# Patient Record
Sex: Male | Born: 1935 | Race: White | Hispanic: No | Marital: Married | Smoking: Former smoker
Health system: Southern US, Community
[De-identification: ages and names within clinical notes are randomized; demographics above are authoritative.]

## PROBLEM LIST (undated history)

## (undated) ENCOUNTER — Emergency Department (HOSPITAL_COMMUNITY): Payer: Medicare Other

## (undated) DIAGNOSIS — I10 Essential (primary) hypertension: Secondary | ICD-10-CM

## (undated) DIAGNOSIS — E785 Hyperlipidemia, unspecified: Secondary | ICD-10-CM

## (undated) HISTORY — DX: Essential (primary) hypertension: I10

## (undated) HISTORY — DX: Hyperlipidemia, unspecified: E78.5

---

## 1999-05-12 ENCOUNTER — Encounter: Admission: RE | Admit: 1999-05-12 | Discharge: 1999-05-12 | Payer: Self-pay | Admitting: Internal Medicine

## 1999-05-12 ENCOUNTER — Encounter: Payer: Self-pay | Admitting: Internal Medicine

## 2010-05-26 ENCOUNTER — Emergency Department (HOSPITAL_COMMUNITY)
Admission: EM | Admit: 2010-05-26 | Discharge: 2010-05-26 | Payer: Self-pay | Source: Home / Self Care | Admitting: Emergency Medicine

## 2014-07-06 ENCOUNTER — Encounter: Payer: Self-pay | Admitting: Gastroenterology

## 2015-01-10 ENCOUNTER — Ambulatory Visit
Admission: RE | Admit: 2015-01-10 | Discharge: 2015-01-10 | Disposition: A | Discharge status: Home / Self Care | Payer: Medicare Other | Source: Ambulatory Visit | Attending: Internal Medicine | Admitting: Internal Medicine

## 2015-01-10 ENCOUNTER — Other Ambulatory Visit: Payer: Self-pay | Admitting: Internal Medicine

## 2015-01-10 DIAGNOSIS — M549 Dorsalgia, unspecified: Secondary | ICD-10-CM

## 2016-04-10 ENCOUNTER — Other Ambulatory Visit: Payer: Self-pay | Admitting: Internal Medicine

## 2016-04-10 ENCOUNTER — Ambulatory Visit
Admission: RE | Admit: 2016-04-10 | Discharge: 2016-04-10 | Disposition: A | Discharge status: Home / Self Care | Payer: Medicare Other | Source: Ambulatory Visit | Attending: Internal Medicine | Admitting: Internal Medicine

## 2016-04-10 DIAGNOSIS — M25552 Pain in left hip: Secondary | ICD-10-CM

## 2019-07-20 ENCOUNTER — Ambulatory Visit: Payer: Medicare Other | Attending: Internal Medicine

## 2019-07-20 DIAGNOSIS — Z20822 Contact with and (suspected) exposure to covid-19: Secondary | ICD-10-CM | POA: Insufficient documentation

## 2019-07-21 LAB — NOVEL CORONAVIRUS, NAA: SARS-CoV-2, NAA: NOT DETECTED

## 2019-07-23 ENCOUNTER — Ambulatory Visit: Payer: Self-pay | Attending: Internal Medicine

## 2019-07-23 DIAGNOSIS — Z23 Encounter for immunization: Secondary | ICD-10-CM | POA: Insufficient documentation

## 2019-07-23 NOTE — Progress Notes (Signed)
   Covid-19 Vaccination Clinic  Name:  Jonathon Benson    MRN: [id number] DOB: 12-16-1930  07/23/2019  Mr. Rickman was observed post Covid-19 immunization for 15 minutes without incidence. He was provided with Vaccine Information Sheet and instruction to access the V-Safe system.   Mr. Battershell was instructed to call 911 with any severe reactions post vaccine: Marland Kitchen Difficulty breathing  . Swelling of your face and throat  . A fast heartbeat  . A bad rash all over your body  . Dizziness and weakness    Immunizations Administered    Name Date Dose VIS Date Route   Pfizer COVID-19 Vaccine 07/23/2019 11:08 AM 0.3 mL 05/19/2019 Intramuscular   Manufacturer: [employer], [employer]   Lot: [id number]   NDC: [address]-6

## 2019-08-15 ENCOUNTER — Ambulatory Visit: Payer: Medicare Other | Attending: Internal Medicine

## 2019-08-15 DIAGNOSIS — Z23 Encounter for immunization: Secondary | ICD-10-CM | POA: Insufficient documentation

## 2019-08-15 NOTE — Progress Notes (Signed)
   Covid-19 Vaccination Clinic  Name:  FRAZIER BALFOUR    MRN: [id number] DOB: [birth year]  08/15/2019  Mr. Bass was observed post Covid-19 immunization for 15 minutes without incident. He was provided with Vaccine Information Sheet and instruction to access the V-Safe system.   Mr. Gratz was instructed to call 911 with any severe reactions post vaccine: Marland Kitchen Difficulty breathing  . Swelling of face and throat  . A fast heartbeat  . A bad rash all over body  . Dizziness and weakness   Immunizations Administered    Name Date Dose VIS Date Route   Pfizer COVID-19 Vaccine 08/15/2019  3:55 PM 0.3 mL 05/19/2019 Intramuscular   Manufacturer: [employer], [employer]   Lot: [id number]   NDC: [address]-9

## 2020-08-20 ENCOUNTER — Other Ambulatory Visit (HOSPITAL_COMMUNITY): Payer: Self-pay | Admitting: Internal Medicine

## 2020-08-20 DIAGNOSIS — I739 Peripheral vascular disease, unspecified: Secondary | ICD-10-CM

## 2020-08-21 ENCOUNTER — Other Ambulatory Visit: Payer: Self-pay

## 2020-08-21 ENCOUNTER — Ambulatory Visit (HOSPITAL_COMMUNITY)
Admission: RE | Admit: 2020-08-21 | Discharge: 2020-08-21 | Disposition: A | Discharge status: Home / Self Care | Payer: Medicare Other | Source: Ambulatory Visit | Attending: Internal Medicine | Admitting: Internal Medicine

## 2020-08-21 DIAGNOSIS — I739 Peripheral vascular disease, unspecified: Secondary | ICD-10-CM | POA: Diagnosis present

## 2021-03-14 ENCOUNTER — Other Ambulatory Visit (HOSPITAL_COMMUNITY): Payer: Self-pay | Admitting: Adult Health

## 2021-03-14 DIAGNOSIS — M542 Cervicalgia: Secondary | ICD-10-CM

## 2021-03-14 DIAGNOSIS — H539 Unspecified visual disturbance: Secondary | ICD-10-CM

## 2021-03-14 DIAGNOSIS — G450 Vertebro-basilar artery syndrome: Secondary | ICD-10-CM

## 2021-03-16 ENCOUNTER — Other Ambulatory Visit: Payer: Self-pay

## 2021-03-16 ENCOUNTER — Ambulatory Visit (HOSPITAL_COMMUNITY)
Admission: RE | Admit: 2021-03-16 | Discharge: 2021-03-16 | Disposition: A | Discharge status: Home / Self Care | Payer: Medicare Other | Source: Ambulatory Visit | Attending: Adult Health | Admitting: Adult Health

## 2021-03-16 DIAGNOSIS — H539 Unspecified visual disturbance: Secondary | ICD-10-CM

## 2021-03-16 DIAGNOSIS — M542 Cervicalgia: Secondary | ICD-10-CM

## 2021-03-16 DIAGNOSIS — I6782 Cerebral ischemia: Secondary | ICD-10-CM | POA: Diagnosis not present

## 2021-03-16 DIAGNOSIS — G450 Vertebro-basilar artery syndrome: Secondary | ICD-10-CM | POA: Diagnosis present

## 2021-03-16 DIAGNOSIS — M4802 Spinal stenosis, cervical region: Secondary | ICD-10-CM | POA: Diagnosis not present

## 2021-03-16 DIAGNOSIS — I669 Occlusion and stenosis of unspecified cerebral artery: Secondary | ICD-10-CM | POA: Diagnosis not present

## 2021-03-16 DIAGNOSIS — I672 Cerebral atherosclerosis: Secondary | ICD-10-CM | POA: Diagnosis not present

## 2021-03-16 DIAGNOSIS — Z8673 Personal history of transient ischemic attack (TIA), and cerebral infarction without residual deficits: Secondary | ICD-10-CM | POA: Insufficient documentation

## 2021-03-16 MED ORDER — GADOBUTROL 1 MMOL/ML IV SOLN
6.0000 mL | Freq: Once | INTRAVENOUS | Status: AC | PRN
  Administered 2021-03-16: 6 mL via INTRAVENOUS

## 2021-03-18 ENCOUNTER — Other Ambulatory Visit (HOSPITAL_COMMUNITY): Payer: Self-pay | Admitting: Internal Medicine

## 2021-03-18 ENCOUNTER — Other Ambulatory Visit: Payer: Self-pay | Admitting: Internal Medicine

## 2021-03-18 ENCOUNTER — Other Ambulatory Visit: Payer: Self-pay | Admitting: Allergy and Immunology

## 2021-03-18 DIAGNOSIS — R930 Abnormal findings on diagnostic imaging of skull and head, not elsewhere classified: Secondary | ICD-10-CM

## 2021-03-20 ENCOUNTER — Other Ambulatory Visit: Payer: Self-pay

## 2021-03-20 ENCOUNTER — Ambulatory Visit (HOSPITAL_COMMUNITY)
Admission: RE | Admit: 2021-03-20 | Discharge: 2021-03-20 | Disposition: A | Discharge status: Home / Self Care | Payer: Medicare Other | Source: Ambulatory Visit | Attending: Internal Medicine | Admitting: Internal Medicine

## 2021-03-20 DIAGNOSIS — R930 Abnormal findings on diagnostic imaging of skull and head, not elsewhere classified: Secondary | ICD-10-CM | POA: Diagnosis present

## 2021-03-20 LAB — POCT I-STAT CREATININE: Creatinine, Ser: 1 mg/dL (ref 0.61–1.24)

## 2021-03-20 MED ORDER — IOHEXOL 350 MG/ML SOLN
80.0000 mL | Freq: Once | INTRAVENOUS | Status: AC | PRN
  Administered 2021-03-20: 80 mL via INTRAVENOUS

## 2021-03-24 ENCOUNTER — Ambulatory Visit: Payer: Medicare Other | Admitting: Neurology

## 2021-03-24 ENCOUNTER — Encounter: Payer: Self-pay | Admitting: Neurology

## 2021-03-24 VITALS — BP 156/71 | HR 96 | Ht 68.0 in | Wt 131.4 lb

## 2021-03-24 DIAGNOSIS — I6381 Other cerebral infarction due to occlusion or stenosis of small artery: Secondary | ICD-10-CM

## 2021-03-24 DIAGNOSIS — H539 Unspecified visual disturbance: Secondary | ICD-10-CM | POA: Diagnosis not present

## 2021-03-24 DIAGNOSIS — G43009 Migraine without aura, not intractable, without status migrainosus: Secondary | ICD-10-CM | POA: Diagnosis not present

## 2021-03-24 MED ORDER — DIVALPROEX SODIUM 500 MG PO DR TAB: 500.0000 mg | DELAYED_RELEASE_TABLET | Freq: Every day | ORAL | 2 refills | Status: DC

## 2021-03-24 NOTE — Patient Instructions (Addendum)
I had a long discussion with the patient with regards to his recurrent stereotypical episodes of transient vision disturbance and headache possibly representing episodes of complicated migraine.  His brain MRI also shows silent cortical and subcortical infarcts and hence recommend he stay on aspirin for stroke prevention and maintain aggressive risk factor modification and check echocardiogram and lipid profile and hemoglobin A1c.  Trial of Depakote ER 500 mg daily for migraine prophylaxis.  He will return for follow-up in the future in 2 months or call earlier if necessary.

## 2021-03-24 NOTE — Progress Notes (Signed)
[facility] [address] [place]. [place] [address] [phone]       OFFICE CONSULT NOTE  Jonathon Benson Date of Birth:  [birth year] Medical Record Number:  [id number]   Referring MD: Ronney Lion, AG-NP  Reason for Referral: Abnormal MRI scan and MRA  HPI: Jonathon Benson is a 87 year old Caucasian male seen today for office consultation visit for episodes of vision disturbances.  History is obtained from the patient, review of electronic medical records and personally reviewed available pertinent imaging films in PACS.  He has past medical history of hypertension, hyperlipidemia, cervical spondylosis and benign prostatic hypertrophy.  For the last 2 years or so patient states he has had recurrent stereotypical episodes which begin with a feeling slightly dizzy followed by right frontal headache with last few minutes and then he has trouble with his vision which he describes as he cannot perceive color vision and all objects appear white.  He is able to see and does not lose vision.  He is fully aware of his surroundings.  He denies any vertigo, dysarthria, focal extremity weakness or numbness.  He does have longstanding dizziness which he describes as gait imbalance.  He has chronic neck pain and cervical spondylosis and underlying spinal stenosis.  He has had a few minor falls but no major injuries.  Patient underwent MRI scan of the cervical spine 03/16/2021 which shows prominent cervical spondylosis with the multifactorial severe spinal stenosis at C3-4 with moderate spinal cord flattening and severe bilateral foraminal narrowing.  C2-3 also shows similar findings with mild cord flattening.  There are additional foraminal stenosis on the left at C4-5, C5-6 and bilaterally at C6-7 as well.  MRI scan of the brain shows small remote age tiny infarcts in the right posterior frontal and parietal lobe as well as right caudate head remote his lacunar infarct.  There are mild  changes of small vessel disease and prominent changes of chronic small vessel disease.  MR angiogram of the neck with and without contrast is suboptimal but shows no significant carotid stenosis in the neck.  There is apparent moderate stenosis of proximal right V1 vertebral artery noted on the report but difficult to appreciate on this poor quality study.  MR angiogram of the brain shows moderate right P2 and questionable inferior division right M2 moderate stenosis as well.  Patient himself denies any episodes suggestive of stroke or TIA.  He states he never been told he has had a stroke before.  He does take aspirin every day.  ROS:   14 system review of systems is positive for imbalance, dizziness, falls, hearing loss, vision disturbance, headache bruising all other systems negative  PMH:  Hypertension Hyperlipidemia Benign prostatic hypertrophy Peripheral vascular disease Cervical spondylosis.  Social History:  Social History   Socioeconomic History   Marital status: Single    Spouse name: Not on file   Number of children: Not on file   Years of education: Not on file   Highest education level: Not on file  Occupational History   Not on file  Tobacco Use   Smoking status: Former    Years: 55.00    Types: Cigarettes   Smokeless tobacco: Current    Types: Chew  Substance and Sexual Activity   Alcohol use: Not Currently   Drug use: Not on file   Sexual activity: Not on file  Other Topics Concern   Not on file  Social History Narrative   Not on file  Social Determinants of Health   Financial Resource Strain: Not on file  Food Insecurity: Not on file  Transportation Needs: Not on file  Physical Activity: Not on file  Stress: Not on file  Social Connections: Not on file  Intimate Partner Violence: Not on file    Medications:   Current Outpatient Medications on File Prior to Visit  Medication Sig Dispense Refill   aspirin EC 81 MG tablet Take 81 mg by mouth daily.  Swallow whole.     atorvastatin (LIPITOR) 10 MG tablet Take 10 mg by mouth daily.     hydrochlorothiazide (HYDRODIURIL) 25 MG tablet Take 25 mg by mouth every morning.     losartan (COZAAR) 100 MG tablet Take 100 mg by mouth daily.     tamsulosin (FLOMAX) 0.4 MG CAPS capsule Take 0.4 mg by mouth daily.     No current facility-administered medications on file prior to visit.    Allergies:  Not on File  Physical Exam General: Frail elderly Caucasian male not in distress, seated, in no evident distress Head: head normocephalic and atraumatic.   Neck: supple with no carotid or supraclavicular bruits Cardiovascular: regular rate and rhythm, no murmurs Musculoskeletal: Mild kyphoscoliosis.  Skin:  no rash/petichiae Vascular:  Normal pulses all extremities  Neurologic Exam Mental Status: Awake and fully alert. Oriented to place and time. Recent and remote memory intact. Attention span, concentration and fund of knowledge appropriate. Mood and affect appropriate.  Cranial Nerves: Fundoscopic exam reveals sharp disc margins. Pupils equal, briskly reactive to light. Extraocular movements full without nystagmus. Visual fields full to confrontation. Hearing mildly diminished bilaterally. Facial sensation intact. Face, tongue, palate moves normally and symmetrically.  Motor: Normal bulk and tone. Normal strength in all tested extremity muscles. Sensory.: intact to touch , pinprick , position and vibratory sensation.  Coordination: Rapid alternating movements normal in all extremities. Finger-to-nose and heel-to-shin performed accurately bilaterally. Gait and Station: Arises from chair without difficulty. Stance is stooped l. Gait demonstrates slight favoring of the right foot.  Unsteady while standing on a narrow base and on either foot unsupported.  Unable to walk tandem without difficulty.    Reflexes: 2+ and symmetric. Toes downgoing.   NIHSS  0 Modified Rankin  2   ASSESSMENT: 87 year old  Caucasian male with recurrent stereotypical transient episodes of vision disturbance and headache possibly atypical migraine episodes.  Abnormal MRI scan showing silent right frontal parietal as well as right subcortical infarcts.  Longstanding gait and balance difficulties likely multifactorial from combination of cerebrovascular disease and degenerative cervical spine disease and old age     PLAN:I had a long discussion with the patient with regards to his recurrent stereotypical episodes of transient vision disturbance and headache possibly representing episodes of complicated migraine.  His brain MRI also shows silent cortical and subcortical infarcts and hence recommend he stay on aspirin for stroke prevention and maintain aggressive risk factor modification and check echocardiogram and lipid profile and hemoglobin A1c.  Trial of Depakote ER 500 mg daily for migraine prophylaxis.  He will return for follow-up in the future in 2 months or call earlier if necessary.  Greater than 50% time during this 45-minute consultation visit was spent on counseling and coordination of care about his episodes of visual dysfunction and silent stroke and answering questions. Delia Heady, MD  Note: This document was prepared with digital dictation and possible smart phrase technology. Any transcriptional errors that result from this process are unintentional.

## 2021-03-25 ENCOUNTER — Telehealth: Payer: Self-pay | Admitting: *Deleted

## 2021-03-25 LAB — LIPID PANEL
Chol/HDL Ratio: 2.3 ratio (ref 0.0–5.0)
Cholesterol, Total: 114 mg/dL (ref 100–199)
HDL: 49 mg/dL (ref >39)
LDL Chol Calc (NIH): 52 mg/dL (ref 0–99)
Triglycerides: 60 mg/dL (ref 0–149)
VLDL Cholesterol Cal: 13 mg/dL (ref 5–40)

## 2021-03-25 LAB — HEMOGLOBIN A1C
Est. average glucose Bld gHb Est-mCnc: 117 mg/dL
Hgb A1c MFr Bld: 5.7 % — ABNORMAL HIGH (ref 4.8–5.6)

## 2021-03-25 LAB — SEDIMENTATION RATE: Sed Rate: 6 mm/hr (ref 0–30)

## 2021-03-25 NOTE — Telephone Encounter (Signed)
I spoke to the patient. He verbalized understanding of the findings.  

## 2021-03-25 NOTE — Telephone Encounter (Signed)
-----  Message from Garvin Fila, MD sent at 03/25/2021  9:01 AM EDT ----- Mitchell Heir inform the patient that lab work for cholesterol profile, screening for diabetes and ESR were all satisfactory ----- Message ----- From: Interface, [facility] Lab Results In Sent: 03/25/2021   5:37 AM EDT To: Garvin Fila, MD

## 2021-04-07 NOTE — Progress Notes (Signed)
Office Note     CC: Right-sided vertebral artery stenosis Requesting Provider:  Charlane Ferretti, DO  HPI: Jonathon Jonathon Benson is a 87 y.o. (1931-05-27) male presenting at Jonathon request of .Jonathon Ferretti, DO after incidental finding on MRI of right-sided V1 segment vertebral artery stenosis.  Jonathon Jonathon Benson presents to Jonathon office today accompanied by his wife.  He is active, and tends to his garden daily.  Over Jonathon last several months, he has appreciated episodes of dizziness, lightheadedness.  When this occurs, his vision is affected-everything appears bright white.  These episodes occur after working for several hours in his garden and are most noted when he goes from bending over to standing up after pulling turnips.  He also appreciates this feeling if he stands up too quickly.  Jonathon symptoms resolved with sitting down.  Jonathon Jonathon Benson symptoms are more pronounced in Jonathon summertime when he is working out in Jonathon heat.  He denies history of syncopal episodes, falls.  Episodes are not precipitated by head movements, nor when he is reaching for things or using his arms. He denies TIA/strokelike symptoms, and has seen a neurologist regarding Jonathon above.  Jonathon Jonathon Benson denies symptoms of claudication, rest pain, ulceration in Jonathon lower extremities.  He does have calf cramping that occurs at night but says this is intermittent.  Jonathon Jonathon Benson wife thinks he does not drink enough water throughout Jonathon day.   Jonathon Jonathon Benson is  on a statin for cholesterol management.  Jonathon Jonathon Benson is  on a daily aspirin.   Other AC:  plavix Jonathon Jonathon Benson is  on medication for hypertension.   Jonathon Jonathon Benson is not diabetic.  Tobacco hx:  former  No past medical history on file.  No past surgical history on file.  Social History   Socioeconomic History   Marital status: Single    Spouse name: Not on file   Number of children: Not on file   Years of education: Not on file   Highest education level: Not on file  Occupational History   Not on file  Tobacco Use   Smoking  status: Former    Years: 55.00    Types: Cigarettes   Smokeless tobacco: Current    Types: Chew  Substance and Sexual Activity   Alcohol use: Not Currently   Drug use: Not on file   Sexual activity: Not on file  Other Topics Concern   Not on file  Social History Narrative   Not on file   Social Determinants of Health   Financial Resource Strain: Not on file  Food Insecurity: Not on file  Transportation Needs: Not on file  Physical Activity: Not on file  Stress: Not on file  Social Connections: Not on file  Intimate Partner Violence: Not on file   No family history on file.  Current Outpatient Medications  Medication Sig Dispense Refill   aspirin EC 81 MG tablet Take 81 mg by mouth daily. Swallow whole.     atorvastatin (LIPITOR) 10 MG tablet Take 10 mg by mouth daily.     divalproex (DEPAKOTE) 500 MG DR tablet Take 1 tablet (500 mg total) by mouth daily. 30 tablet 2   hydrochlorothiazide (HYDRODIURIL) 25 MG tablet Take 25 mg by mouth every morning.     losartan (COZAAR) 100 MG tablet Take 100 mg by mouth daily.     tamsulosin (FLOMAX) 0.4 MG CAPS capsule Take 0.4 mg by mouth daily.     No current facility-administered medications for this visit.    Not  on File   REVIEW OF SYSTEMS:   [X]  denotes positive finding, [ ]  denotes negative finding Cardiac  Comments:  Chest pain or chest pressure:    Shortness of breath upon exertion:    Short of breath when lying flat:    Irregular heart rhythm:        Vascular    Pain in calf, thigh, or hip brought on by ambulation:    Pain in feet at night that wakes you up from your sleep:     Blood clot in your veins:    Leg swelling:         Pulmonary    Oxygen at home:    Productive cough:     Wheezing:         Neurologic    Sudden weakness in arms or legs:     Sudden numbness in arms or legs:     Sudden onset of difficulty speaking or slurred speech:    Temporary loss of vision in one eye:     Problems with dizziness:          Gastrointestinal    Blood in stool:     Vomited blood:         Genitourinary    Burning when urinating:     Blood in urine:        Psychiatric    Major depression:         Hematologic    Bleeding problems:    Problems with blood clotting too easily:        Skin    Rashes or ulcers:        Constitutional    Fever or chills:      PHYSICAL EXAMINATION:  There were no vitals filed for this visit.  General:  WDWN in NAD; vital signs documented above Jonathon Jonathon Benson: Not observed HENT: WNL, normocephalic Pulmonary: normal non-labored breathing , without Rales, rhonchi,  wheezing Cardiac: regular HR Abdomen: soft, NT, no masses Skin: without rashes Vascular Exam/Pulses:  Right Left  Radial 2+ (normal) 2+ (normal)  Ulnar 2+ (normal) 2+ (normal)  Femoral    Popliteal    DP 2+ (normal) 2+ (normal)  Jonathon Benson 2+ (normal) 2+ (normal)   Extremities: without ischemic changes, without Gangrene , without cellulitis; without open wounds;  Musculoskeletal: no muscle wasting or atrophy  Neurologic: A&O X 3;  No focal weakness or paresthesias are detected Psychiatric:  Jonathon Jonathon Benson has Normal affect.   Non-Invasive Vascular Imaging:    Vertebral arteries patent within Jonathon neck. Apparent moderate/severe stenosis within Jonathon proximal V1 right vertebral artery. No appreciable significant stenosis within Jonathon dominant left vertebral artery within Jonathon neck.    ASSESSMENT/PLAN: Jonathon Jonathon Benson is a 87 y.o. male with dizziness and vision changes occurring when moving to Jonathon upright position.  Jonathon Jonathon Benson has a long smoking history with MRI demonstrating multiple areas of small vessel disease and infarct.  On MRI, carotid arteries demonstrate less than 50% stenosis, left vertebral artery normal -dominant right-sided V1 stenosis.  Jonathon Jonathon Benson symptoms as described in Jonathon subjective portion of this note are most similar to orthostatic hypotension.  Timing and consistency of symptoms are not consistent with  vertebrobasilar insufficiency or white light amaurosis.  Jonathon Jonathon Benson is on several blood pressure medications and lives an active lifestyle.  I discussed conservative changes such as increasing his fluid intake daily. I asked that he discuss blood pressure control with his primary care, Dr. Molly Maduro.   I have ordered a bilateral carotid  duplex ultrasound which will include bilateral vertebral and subclavian artery velocities to ensure there is no subclavian steal physiology.   Victorino Sparrow, MD [facility] [phone]

## 2021-04-11 ENCOUNTER — Encounter: Payer: Self-pay | Admitting: Vascular Surgery

## 2021-04-11 ENCOUNTER — Other Ambulatory Visit: Payer: Self-pay

## 2021-04-11 ENCOUNTER — Ambulatory Visit: Payer: Medicare Other | Admitting: Vascular Surgery

## 2021-04-11 VITALS — BP 170/76 | HR 67 | Temp 98.2°F | Resp 20 | Ht 68.0 in | Wt 135.0 lb

## 2021-04-11 DIAGNOSIS — R42 Dizziness and giddiness: Secondary | ICD-10-CM

## 2021-04-11 DIAGNOSIS — I6529 Occlusion and stenosis of unspecified carotid artery: Secondary | ICD-10-CM

## 2021-04-14 ENCOUNTER — Ambulatory Visit (HOSPITAL_COMMUNITY)
Admission: RE | Admit: 2021-04-14 | Discharge: 2021-04-14 | Disposition: A | Discharge status: Home / Self Care | Payer: Medicare Other | Source: Ambulatory Visit | Attending: Vascular Surgery | Admitting: Vascular Surgery

## 2021-04-14 ENCOUNTER — Other Ambulatory Visit: Payer: Self-pay

## 2021-04-14 DIAGNOSIS — I6529 Occlusion and stenosis of unspecified carotid artery: Secondary | ICD-10-CM | POA: Diagnosis present

## 2021-06-30 ENCOUNTER — Other Ambulatory Visit: Payer: Self-pay | Admitting: Neurology

## 2021-06-30 NOTE — Telephone Encounter (Signed)
Rx refilled.

## 2021-07-17 ENCOUNTER — Ambulatory Visit: Payer: Medicare Other | Admitting: Neurology

## 2021-08-13 ENCOUNTER — Other Ambulatory Visit: Payer: Self-pay | Admitting: Neurology

## 2022-08-09 IMAGING — CT CT MAXILLOFACIAL W/ CM
3 series · 15 of 47 positions shown, 18 images · IV contrast (omnipaque)
Comparison: MRI head without with contrast 03/16/2021

CLINICAL DATA: Abnormal head MRI. Lesions left ethmoid sinus and
left frontal sinus on MRI.

EXAM:
CT MAXILLOFACIAL WITH CONTRAST
TECHNIQUE: Multidetector CT imaging of the maxillofacial structures was
performed with intravenous contrast. Multiplanar CT image
reconstructions were also generated.
CONTRAST:  80mL OMNIPAQUE IOHEXOL 350 MG/ML SOLN

[Series 3: max soft · axial · 0.52mm/px · z∈[+1576,+1730]mm · 9 of 84 slices shown, 12 images]
[im 6/84  brain]
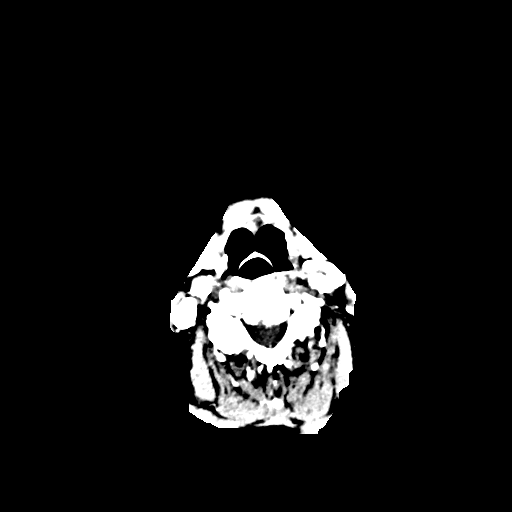
[im 6/84  bone]
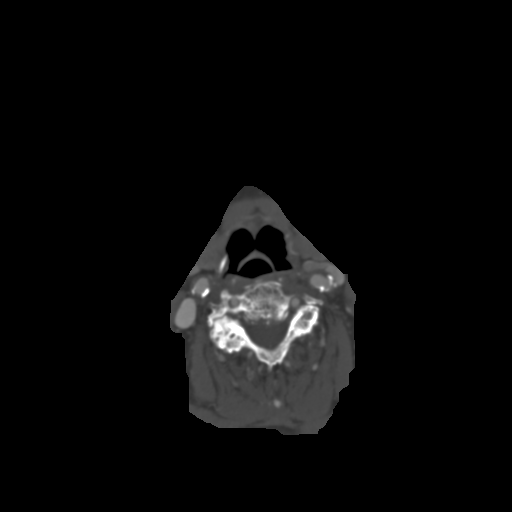
[im 15/84  bone]
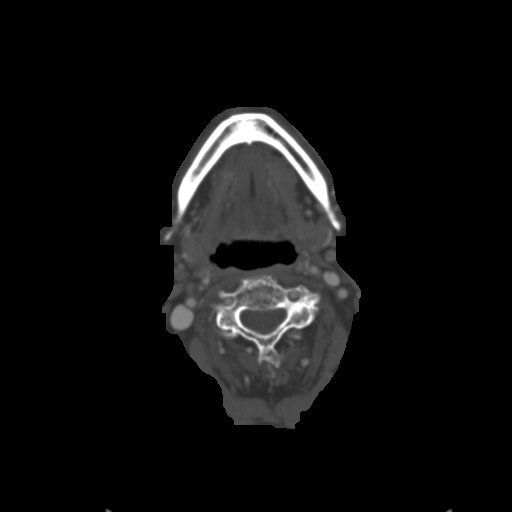
[im 23/84  bone]
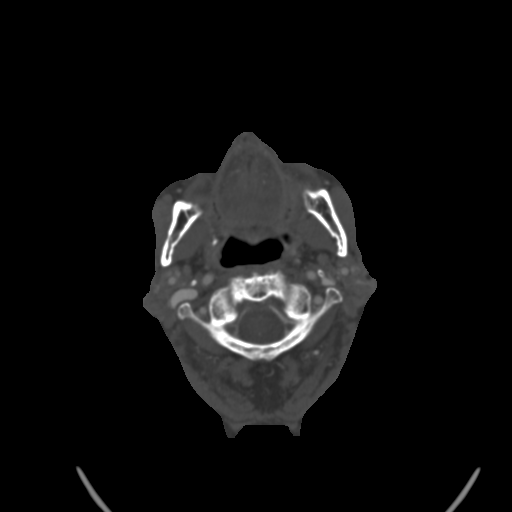
[im 32/84  bone]
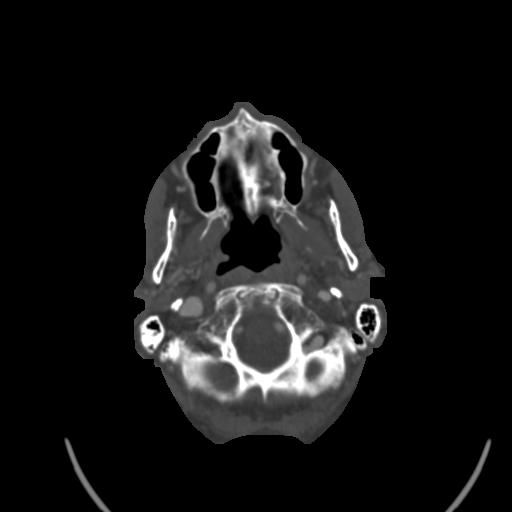
[im 43/84  brain]
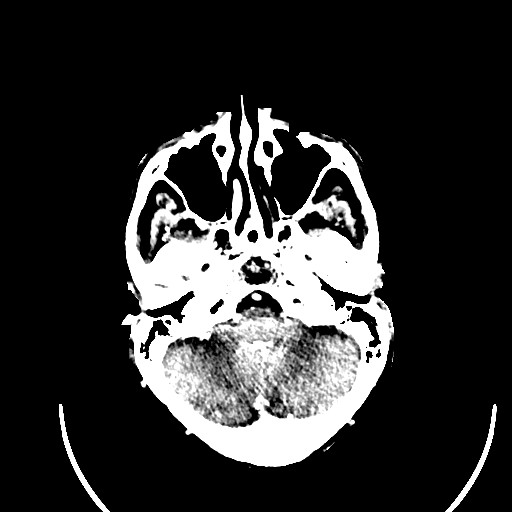
[im 43/84  bone]
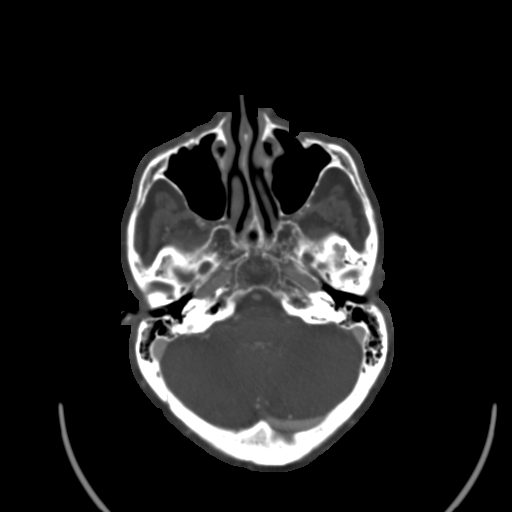
[im 52/84  bone]
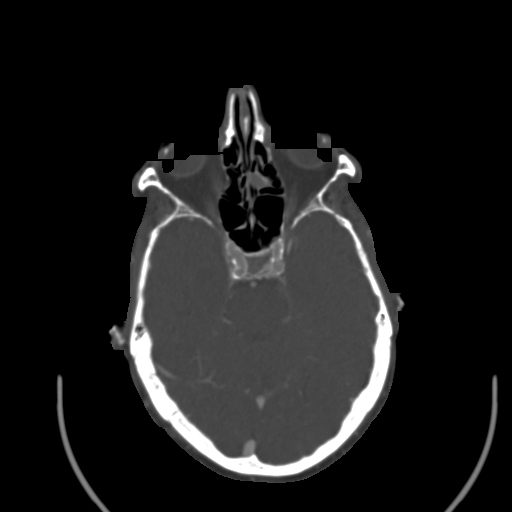
[im 61/84  bone]
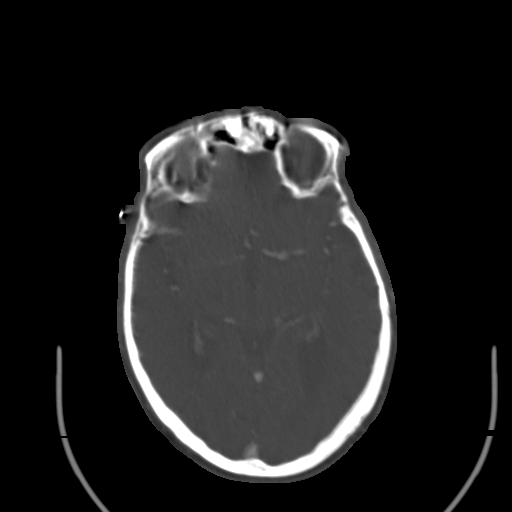
[im 69/84  bone]
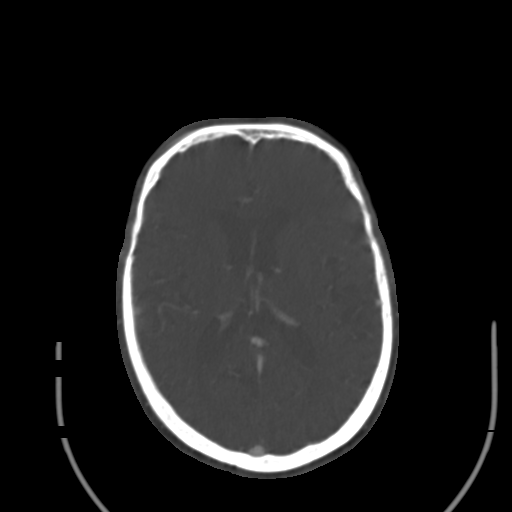
[im 78/84  brain]
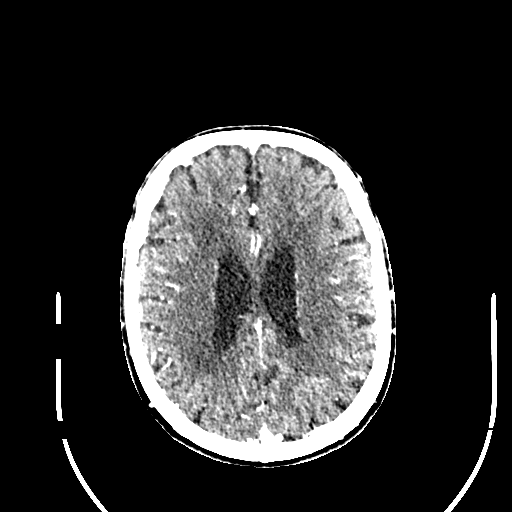
[im 78/84  bone]
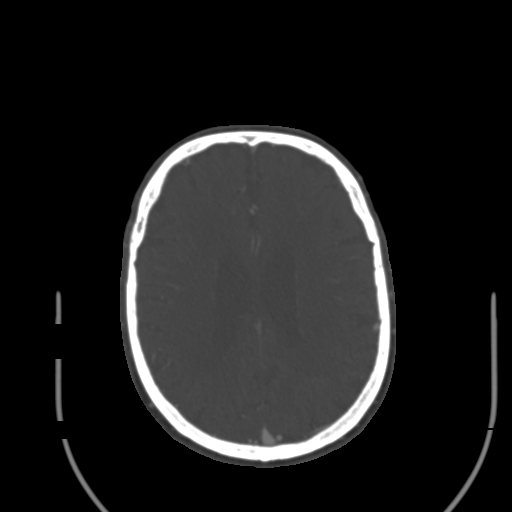

[Series 6: coronal soft · coronal · 0.37mm/px · 3 of 116 slices shown]
[im 39/116  bone]
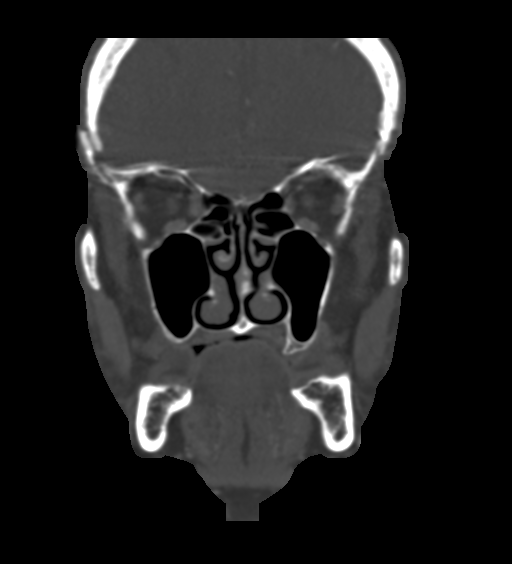
[im 52/116  bone]
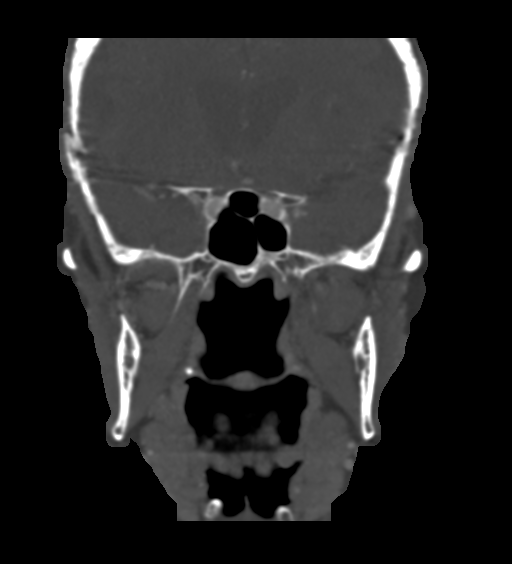
[im 64/116  bone]
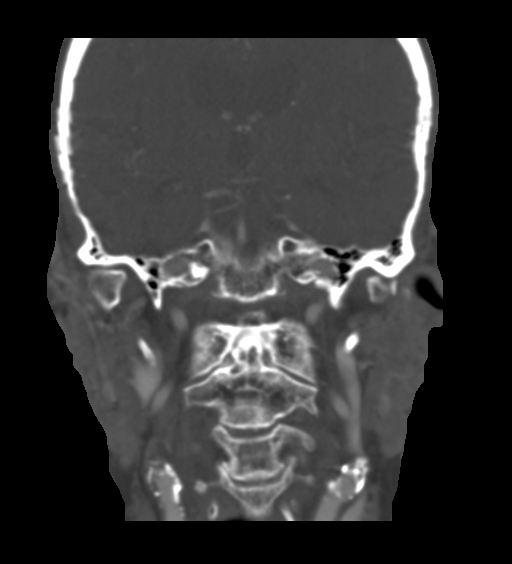

[Series 8: sagittal soft · sagittal · 0.41mm/px · 3 of 81 slices shown]
[im 27/81  bone]
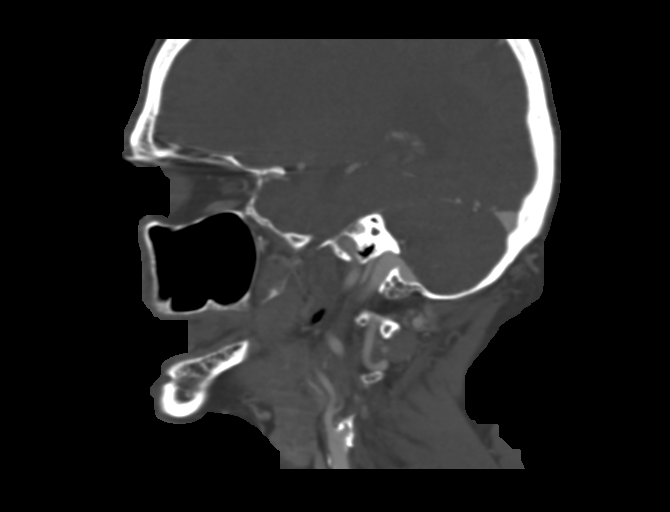
[im 41/81  bone]
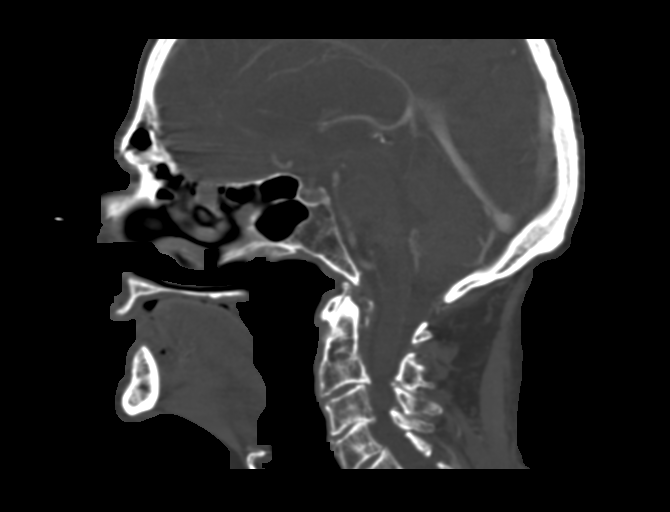
[im 54/81  bone]
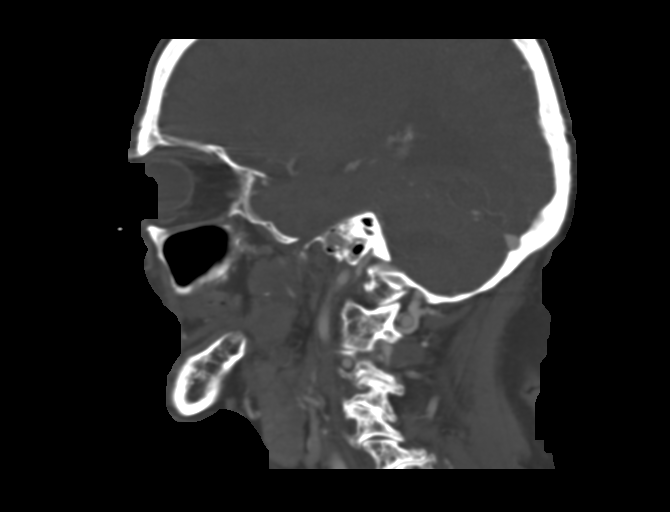

[15 of 47 positions shown; findings below may reference images not displayed]

FINDINGS: Osseous: No facial fracture or bony lesion identified.

Orbits: Bilateral cataract extraction.  No orbital mass or edema.

Sinuses: Left frontal sinus osteoma measuring approximately 8 x 12
mm. This corresponds to the MRI. This shows mild enhancement on MRI
but appears benign by CT. There is some surrounding bony sclerosis
in the left frontal sinus which may be due to chronic inflammation.

10 mm soft tissue density left ethmoid sinus as noted on MRI. This
is not appear to enhance on MRI. No bony destruction. No defect in
the cribriform plate. Nonaggressive appearance.

Remaining paranasal sinuses clear.  Mastoid clear bilaterally.

Soft tissues: No soft tissue mass or adenopathy. Atherosclerotic
calcification carotid bifurcation bilaterally.

Limited intracranial: Negative for acute abnormality.
IMPRESSION: 1. Left frontal sinus osteoma with surrounding bony sclerosis
corresponds to the MRI. No evidence of neoplasm in this area
2. 10 mm soft tissue density left ethmoid sinus with nonaggressive
appearance. Probable retained secretions versus polyp. Direct
visualization recommended.

## 2023-08-09 ENCOUNTER — Other Ambulatory Visit: Payer: Self-pay

## 2023-08-09 ENCOUNTER — Encounter (HOSPITAL_BASED_OUTPATIENT_CLINIC_OR_DEPARTMENT_OTHER): Payer: Self-pay

## 2023-08-09 ENCOUNTER — Emergency Department (HOSPITAL_BASED_OUTPATIENT_CLINIC_OR_DEPARTMENT_OTHER)
Admission: EM | Admit: 2023-08-09 | Discharge: 2023-08-09 | Discharge status: Against Medical Advice | Attending: Emergency Medicine | Admitting: Emergency Medicine

## 2023-08-09 DIAGNOSIS — M79604 Pain in right leg: Secondary | ICD-10-CM | POA: Diagnosis present

## 2023-08-09 DIAGNOSIS — Z5321 Procedure and treatment not carried out due to patient leaving prior to being seen by health care provider: Secondary | ICD-10-CM | POA: Insufficient documentation

## 2023-08-09 NOTE — ED Triage Notes (Signed)
 Pt c/o R leg pain x6mo. "I was supposed to go to [facility] Mar 13 but I can't wait, the pain is too bad." Denies known injury. Leg is not red, no swelling noted.  Prednisone for pain, says it helps

## 2023-08-09 NOTE — ED Notes (Signed)
 Called x2 for room 11. No answer.

## 2023-08-09 NOTE — ED Notes (Signed)
No answer when called for treatment room x2

## 2024-04-06 NOTE — Progress Notes (Signed)
 REFERRING PHYSICIAN:  Angelia Pierce, NP PROVIDER:  DEWARD GARNETTE NULL, MD MRN: [id number] DOB: [birth year] DATE OF ENCOUNTER: 04/06/2024 Subjective    Chief Complaint: New Consultation   History of Present Illness: Jonathon Benson is a 89 y.o. male who is seen today as an office consultation for evaluation of New Consultation   We are asked to see the patient in consultation by Dr. Pierce Angelia to evaluate him for a left inguinal hernia.  The patient is a 89 year old white male who has noticed a bulge in his left groin for several months.  He has some occasional tenderness associated with it.  He denies any nausea or vomiting.  His appetite is good and his bowels are working regularly.  He does have some high blood pressure but it is well-controlled.  He says he is not on blood thinners.  He did have a many stroke about 4 months ago but has no residual deficits.    Review of Systems: A complete review of systems was obtained from the patient.  I have reviewed this information and discussed as appropriate with the patient.  See HPI as well for other ROS.  ROS   Medical History: Past Medical History:  Diagnosis Date  . Arthritis   . GERD (gastroesophageal reflux disease)   . Hyperlipidemia   . Hypertension     Patient Active Problem List  Diagnosis  . Left inguinal hernia    History reviewed. No pertinent surgical history.   No Known Allergies  Current Outpatient Medications on File Prior to Visit  Medication Sig Dispense Refill  . aspirin 81 MG EC tablet Take 81 mg by mouth once daily    . atorvastatin (LIPITOR) 10 MG tablet Take 10 mg by mouth once daily    . celecoxib (CELEBREX) 100 MG capsule Take 100 mg by mouth 2 (two) times daily as needed    . divalproex  (DEPAKOTE ) 500 MG DR tablet Take 500 mg by mouth once daily    . finasteride (PROSCAR) 5 mg tablet Take 5 mg by mouth once daily    . hydroCHLOROthiazide (HYDRODIURIL) 25 MG tablet TAKE 1 TABLET BY MOUTH DAILY  IN THE MORNING; Duration: 90    . meloxicam (MOBIC) 7.5 MG tablet 1 tablet Orally Once a day as needed; Duration: 90 days    . pantoprazole (PROTONIX) 40 MG DR tablet 1 tablet Orally daily for stomach protection; Duration: 90 days    . pregabalin (LYRICA) 75 MG capsule 1 capsule    . rosuvastatin (CRESTOR) 10 MG tablet 1 tablet Orally Once a day; Duration: 90 days     No current facility-administered medications on file prior to visit.    Family History  Problem Relation Age of Onset  . High blood pressure (Hypertension) Sister   . Diabetes Sister      Social History   Tobacco Use  Smoking Status Former  . Types: Cigarettes  . Start date: 32  Smokeless Tobacco Never     Social History   Socioeconomic History  . Marital status: Married  Tobacco Use  . Smoking status: Former    Types: Cigarettes    Start date: 52  . Smokeless tobacco: Never  Substance and Sexual Activity  . Alcohol use: Never  . Drug use: Never   Social Drivers of Health   Housing Stability: Unknown (04/06/2024)   Housing Stability Vital Sign   . Homeless in the Last Year: No    Objective:   Vitals:  04/06/24 1143 04/06/24 1144  BP: (!) 146/70   Pulse: 98   Temp: 36.7 C (98.1 F)   SpO2: 98%   Weight: 55.3 kg (122 lb)   Height: 175.3 cm (5' 9)   PainSc:  0-No pain    Body mass index is 18.02 kg/m.  Physical Exam Constitutional:      General: He is not in acute distress.    Appearance: Normal appearance.  HENT:     Head: Normocephalic and atraumatic.     Right Ear: External ear normal.     Left Ear: External ear normal.     Nose: Nose normal.     Mouth/Throat:     Mouth: Mucous membranes are moist.     Pharynx: Oropharynx is clear.  Eyes:     General: No scleral icterus.    Extraocular Movements: Extraocular movements intact.     Conjunctiva/sclera: Conjunctivae normal.     Pupils: Pupils are equal, round, and reactive to light.  Cardiovascular:     Rate and Rhythm:  Normal rate and regular rhythm.     Pulses: Normal pulses.     Heart sounds: Normal heart sounds.  Pulmonary:     Effort: Pulmonary effort is normal. No respiratory distress.     Breath sounds: Normal breath sounds.  Abdominal:     General: Abdomen is flat. Bowel sounds are normal. There is no distension.     Palpations: Abdomen is soft.     Tenderness: There is no abdominal tenderness.  Genitourinary:    Comments: There is a moderate-sized left inguinal hernia that reduces with palpation.  There is no palpable bulge or impulse with straining in the right groin Musculoskeletal:        General: No swelling or deformity. Normal range of motion.     Cervical back: Normal range of motion and neck supple. No tenderness.  Skin:    General: Skin is warm and dry.     Coloration: Skin is not jaundiced.  Neurological:     General: No focal deficit present.     Mental Status: He is alert and oriented to person, place, and time.  Psychiatric:        Mood and Affect: Mood normal.        Behavior: Behavior normal.        Labs, Imaging and Diagnostic Testing:   Assessment and Plan:     Diagnoses and all orders for this visit:  Left inguinal hernia -     [facility] Case Posting Request; Future    The patient appears to have a moderate-sized symptomatic left inguinal hernia.  Because of the risk of incarceration and strangulation I feel he would benefit from having this fixed.  He would also like to have this done.  I have discussed with him in detail the risks and benefits of the operation as well as some of the technical aspects including the use of mesh and the risk of chronic pain and he understands and wishes to proceed.  We will ask for medical clearance from his medical doctor and then move forward with surgical scheduling.    DEWARD GARNETTE NULL, MD    I spent a total of 45 minutes in both face-to-face and non-face-to-face activities, excluding procedures performed, for this visit on the  date of this encounter.

## 2024-05-08 ENCOUNTER — Other Ambulatory Visit: Payer: Self-pay

## 2024-05-08 ENCOUNTER — Emergency Department (HOSPITAL_COMMUNITY)

## 2024-05-08 ENCOUNTER — Emergency Department (HOSPITAL_COMMUNITY)
Admission: EM | Admit: 2024-05-08 | Discharge: 2024-05-08 | Disposition: A | Discharge status: Home / Self Care | Attending: Emergency Medicine | Admitting: Emergency Medicine

## 2024-05-08 ENCOUNTER — Encounter (HOSPITAL_COMMUNITY): Payer: Self-pay

## 2024-05-08 DIAGNOSIS — M549 Dorsalgia, unspecified: Secondary | ICD-10-CM | POA: Diagnosis present

## 2024-05-08 DIAGNOSIS — Z79899 Other long term (current) drug therapy: Secondary | ICD-10-CM | POA: Diagnosis not present

## 2024-05-08 DIAGNOSIS — Z7982 Long term (current) use of aspirin: Secondary | ICD-10-CM | POA: Insufficient documentation

## 2024-05-08 DIAGNOSIS — R000 Tachycardia, unspecified: Secondary | ICD-10-CM | POA: Diagnosis not present

## 2024-05-08 DIAGNOSIS — I10 Essential (primary) hypertension: Secondary | ICD-10-CM | POA: Insufficient documentation

## 2024-05-08 DIAGNOSIS — Y9222 Religious institution as the place of occurrence of the external cause: Secondary | ICD-10-CM | POA: Diagnosis not present

## 2024-05-08 DIAGNOSIS — W0110XA Fall on same level from slipping, tripping and stumbling with subsequent striking against unspecified object, initial encounter: Secondary | ICD-10-CM | POA: Insufficient documentation

## 2024-05-08 DIAGNOSIS — Z7902 Long term (current) use of antithrombotics/antiplatelets: Secondary | ICD-10-CM | POA: Insufficient documentation

## 2024-05-08 DIAGNOSIS — M545 Low back pain, unspecified: Secondary | ICD-10-CM | POA: Insufficient documentation

## 2024-05-08 DIAGNOSIS — M546 Pain in thoracic spine: Secondary | ICD-10-CM | POA: Diagnosis not present

## 2024-05-08 DIAGNOSIS — D72829 Elevated white blood cell count, unspecified: Secondary | ICD-10-CM | POA: Insufficient documentation

## 2024-05-08 DIAGNOSIS — S3210XA Unspecified fracture of sacrum, initial encounter for closed fracture: Secondary | ICD-10-CM

## 2024-05-08 LAB — COMPREHENSIVE METABOLIC PANEL WITH GFR
ALT: 15 U/L (ref 0–44)
AST: 25 U/L (ref 15–41)
Albumin: 3.6 g/dL (ref 3.5–5.0)
Alkaline Phosphatase: 106 U/L (ref 38–126)
Anion gap: 14 (ref 5–15)
BUN: 31 mg/dL — ABNORMAL HIGH (ref 8–23)
CO2: 25 mmol/L (ref 22–32)
Calcium: 9.7 mg/dL (ref 8.9–10.3)
Chloride: 100 mmol/L (ref 98–111)
Creatinine, Ser: 0.97 mg/dL (ref 0.61–1.24)
GFR, Estimated: >60 mL/min (ref >60)
Glucose, Bld: 101 mg/dL — ABNORMAL HIGH (ref 70–99)
Potassium: 3.6 mmol/L (ref 3.5–5.1)
Sodium: 139 mmol/L (ref 135–145)
Total Bilirubin: 1.5 mg/dL — ABNORMAL HIGH (ref 0.0–1.2)
Total Protein: 7.1 g/dL (ref 6.5–8.1)

## 2024-05-08 LAB — CBC WITH DIFFERENTIAL/PLATELET
Abs Immature Granulocytes: 0.04 K/uL (ref 0.00–0.07)
Basophils Absolute: 0.1 K/uL (ref 0.0–0.1)
Basophils Relative: 1 %
Eosinophils Absolute: 0 K/uL (ref 0.0–0.5)
Eosinophils Relative: 0 %
HCT: 38.6 % — ABNORMAL LOW (ref 39.0–52.0)
Hemoglobin: 13.2 g/dL (ref 13.0–17.0)
Immature Granulocytes: 0 %
Lymphocytes Relative: 11 %
Lymphs Abs: 1.3 K/uL (ref 0.7–4.0)
MCH: 32.9 pg (ref 26.0–34.0)
MCHC: 34.2 g/dL (ref 30.0–36.0)
MCV: 96.3 fL (ref 80.0–100.0)
Monocytes Absolute: 0.8 K/uL (ref 0.1–1.0)
Monocytes Relative: 7 %
Neutro Abs: 9.2 K/uL — ABNORMAL HIGH (ref 1.7–7.7)
Neutrophils Relative %: 81 %
Platelets: 450 K/uL — ABNORMAL HIGH (ref 150–400)
RBC: 4.01 MIL/uL — ABNORMAL LOW (ref 4.22–5.81)
RDW: 11.8 % (ref 11.5–15.5)
WBC: 11.4 K/uL — ABNORMAL HIGH (ref 4.0–10.5)
nRBC: 0 % (ref 0.0–0.2)

## 2024-05-08 LAB — URINALYSIS, W/ REFLEX TO CULTURE (INFECTION SUSPECTED)
Bilirubin Urine: NEGATIVE
Glucose, UA: NEGATIVE mg/dL
Hgb urine dipstick: NEGATIVE
Ketones, ur: 40 mg/dL — AB
Leukocytes,Ua: NEGATIVE
Nitrite: NEGATIVE
Protein, ur: NEGATIVE mg/dL
Specific Gravity, Urine: 1.015 (ref 1.005–1.030)
Squamous Epithelial / HPF: NONE SEEN /HPF (ref 0–5)
pH: 7.5 (ref 5.0–8.0)

## 2024-05-08 MED ORDER — OXYCODONE-ACETAMINOPHEN 5-325 MG PO TABS
1.0000 | ORAL_TABLET | Freq: Once | ORAL | Status: AC
  Administered 2024-05-08: 1 via ORAL
  Filled 2024-05-08: qty 1

## 2024-05-08 MED ORDER — FENTANYL CITRATE (PF) 50 MCG/ML IJ SOSY
50.0000 ug | PREFILLED_SYRINGE | Freq: Once | INTRAMUSCULAR | Status: AC
  Administered 2024-05-08: 50 ug via INTRAVENOUS
  Filled 2024-05-08: qty 1

## 2024-05-08 MED ORDER — OXYCODONE-ACETAMINOPHEN 5-325 MG PO TABS: 1.0000 | ORAL_TABLET | Freq: Four times a day (QID) | ORAL | 0 refills | Status: DC | PRN

## 2024-05-08 MED ORDER — IOHEXOL 350 MG/ML SOLN
75.0000 mL | Freq: Once | INTRAVENOUS | Status: AC | PRN
  Administered 2024-05-08: 75 mL via INTRAVENOUS

## 2024-05-08 MED ORDER — LACTATED RINGERS IV BOLUS
500.0000 mL | Freq: Once | INTRAVENOUS | Status: AC
  Administered 2024-05-08: 500 mL via INTRAVENOUS

## 2024-05-08 NOTE — Discharge Instructions (Addendum)
 Your MRIs show that you broke your sacrum in multiple places.  Use a walker to help walk.  We are prescribing oxycodone to help with pain.  Have caution as this is a narcotic and can cause potential side effect such as dizziness, lightheadedness, sleepiness, etc.  Do not drive or operate heavy machinery with this.  Do not combine with other medicines.  It has some Tylenol in it but you may still take Tylenol over-the-counter as well, up to 4000 mg in total per day.  Follow-up with your primary care physician as well as your orthopedist.  We are referring you to an orthopedist if you do not have 1.  Your blood pressure was pretty elevated in the emergency department though it is hard to tell if this was secondary to the fractures and pain.  Get this rechecked with your primary care doctor.  If you develop new or worsening back pain, weakness or numbness in the legs, or any other new/concerning symptoms then return to the ER.

## 2024-05-08 NOTE — TOC CM/SW Note (Addendum)
 TOC consult received for possible HH/DME needs. Epic message sent to MD requesting orders for [facility] and any needed DME. Awaiting response.   Merilee Batty, MSN, RN Case Management [phone]: Order received for [facility]. Call placed to [employer] rep at [phone]. He will deliver walker to bedside for patient.

## 2024-05-08 NOTE — ED Provider Notes (Signed)
 [place] [facility] AT [facility] Provider Note   CSN: [id number] Arrival date & time: 05/08/24  9143     Patient presents with: Back Pain   PERL KERNEY is a 89 y.o. male.   HPI      89yo male with history of hypertension, hyperlipidemia who presents with concern for back pain and leg pain.   He had a fall at church a few weeks ago, his pain has been worsening in his back and legs---middle back and legs.  Reports that it has been worsening.  He has had a hard time sleeping, has not been eating well due to pain.  Denies chest pain.  Lives at home with his wife and his wife's mother. Denies numbness, can't tell if weak or if pain related, more diffult moving leg on right with the pain.  No loss of bowel control> Reports maybe he has some difficulty urinating but no overflow incontinence.      Past Medical History:  Diagnosis Date   Hyperlipidemia    Hypertension      Prior to Admission medications   Medication Sig Start Date End Date Taking? Authorizing Provider  aspirin EC 81 MG tablet Take 81 mg by mouth daily. Swallow whole.    [provider]  atorvastatin (LIPITOR) 10 MG tablet Take 10 mg by mouth daily. 03/16/21   [provider]  clopidogrel (PLAVIX) 75 MG tablet Take 75 mg by mouth daily. 03/17/21   [provider]  divalproex  (DEPAKOTE ) 500 MG DR tablet TAKE 1 TABLET(500 MG) BY MOUTH DAILY 08/13/21   Sethi, Pramod S, MD  hydrochlorothiazide (HYDRODIURIL) 25 MG tablet Take 25 mg by mouth every morning. 03/16/21   [provider]  losartan (COZAAR) 100 MG tablet Take 100 mg by mouth daily. 03/16/21   [provider]  tamsulosin (FLOMAX) 0.4 MG CAPS capsule Take 0.4 mg by mouth daily. 03/16/21   [provider]    Allergies: Patient has no known allergies.    Review of Systems  Updated Vital Signs BP (!) 184/88   Pulse (!) 102   Temp 97.6 F (36.4 C)   Resp 18   Ht 5' 8 (1.727 m)    Wt 52.2 kg   SpO2 99%   BMI 17.49 kg/m   Physical Exam Vitals and nursing note reviewed.  Constitutional:      General: He is in acute distress (on initial evaluation reports pain).     Appearance: He is well-developed. He is not diaphoretic.  HENT:     Head: Normocephalic and atraumatic.  Eyes:     Conjunctiva/sclera: Conjunctivae normal.  Cardiovascular:     Rate and Rhythm: Regular rhythm. Tachycardia present.     Heart sounds: Normal heart sounds. No murmur heard.    No friction rub. No gallop.  Pulmonary:     Effort: Pulmonary effort is normal. No respiratory distress.     Breath sounds: Normal breath sounds. No wheezing or rales.  Abdominal:     General: There is no distension.     Palpations: Abdomen is soft.     Tenderness: There is no abdominal tenderness. There is no guarding.  Musculoskeletal:        General: Tenderness (thoracic, lumbar) present.     Cervical back: Normal range of motion.  Skin:    General: Skin is warm and dry.  Neurological:     Mental Status: He is alert and oriented to person, place, and time.  Sensory: No sensory deficit.     Motor: Weakness: strength apepars 5/5 but is limited by pain, right leg weaker/or more painful than left with hip flexion. distal strength normal.     (all labs ordered are listed, but only abnormal results are displayed) Labs Reviewed  CBC WITH DIFFERENTIAL/PLATELET - Abnormal; Notable for the following components:      Result Value   WBC 11.4 (*)    RBC 4.01 (*)    HCT 38.6 (*)    Platelets 450 (*)    Neutro Abs 9.2 (*)    All other components within normal limits  COMPREHENSIVE METABOLIC PANEL WITH GFR - Abnormal; Notable for the following components:   Glucose, Bld 101 (*)    BUN 31 (*)    Total Bilirubin 1.5 (*)    All other components within normal limits  URINALYSIS, W/ REFLEX TO CULTURE (INFECTION SUSPECTED) - Abnormal; Notable for the following components:   Ketones, ur 40 (*)    Bacteria, UA FEW  (*)    All other components within normal limits    EKG: EKG Interpretation Date/Time:  Monday May 08 2024 10:36:21 EST Ventricular Rate:  98 PR Interval:    QRS Duration:  88 QT Interval:  368 QTC Calculation: 470 R Axis:   43  Text Interpretation: Artifact, susupect sinus rhythm Confirmed by Dreama Longs ([id number]) on 05/08/2024 12:32:59 PM  Radiology: MR THORACIC SPINE WO CONTRAST Result Date: 05/08/2024 CLINICAL DATA:  Back pain after falling at church. Age-indeterminate midthoracic compression deformities on CT. EXAM: MRI THORACIC AND LUMBAR SPINE WITHOUT CONTRAST TECHNIQUE: Multiplanar and multiecho pulse sequences of the thoracic and lumbar spine were obtained without intravenous contrast. COMPARISON:  Same day CT of the chest, abdomen and pelvis with reformatted images of the thoracolumbar spine. FINDINGS: MRI THORACIC SPINE FINDINGS Alignment: Near anatomic. Minimal degenerative anterolisthesis at T10-11. Vertebrae: The previously demonstrated mild superior endplate compression deformities at T5 and T6 appear healed, without residual marrow edema. No evidence of acute thoracic spine fracture, osseous retropulsion or aggressive osseous lesion. Cord:  The thoracic cord appears normal in signal and caliber. Paraspinal and other soft tissues: No significant paraspinal abnormalities. Disc levels: Sagittal images demonstrate spondylosis in the lower cervical spine with disc space narrowing and posterior osteophytes at C6-7 and C7-T1. There is mild spinal stenosis at C6-7 and mild foraminal narrowing bilaterally at both levels. No significant spinal stenosis or foraminal narrowing in the upper thoracic spine. T7-8: Small right paracentral disc protrusion without cord deformity or significant foraminal narrowing. T8-9: Loss of disc height with disc bulging and posterior osteophytes. No spinal stenosis. Mild foraminal narrowing bilaterally. T9-10: Mild loss of disc height with a shallow right  paracentral disc protrusion and bilateral facet hypertrophy. No spinal stenosis. Mild foraminal narrowing bilaterally. T10-11: Loss of disc height with disc bulging and posterior osteophytes. Moderate bilateral facet hypertrophy. Resulting mild spinal stenosis without cord deformity. Mild to moderate foraminal narrowing bilaterally. T11-12: Mild disc bulging and bilateral facet hypertrophy. No significant spinal stenosis or foraminal narrowing. T12-L1: Mild disc bulging and endplate osteophytes asymmetric to the left. No significant spinal stenosis. Mild left foraminal narrowing. MRI LUMBAR SPINE FINDINGS Segmentation: There are 5 lumbar type vertebral bodies. Alignment: Convex left scoliosis with grade 1 retrolisthesis at L5-S1. Vertebrae: No evidence of acute lumbar spine fracture, pars defect or osteomyelitis. There is marrow edema in both sacral ala consistent with nondisplaced acute fractures. There is mild focal angulation at S3. Mild sacroiliac degenerative changes bilaterally. Conus medullaris:  Extends to the L1 level and appears normal. Paraspinal and other soft tissues: No significant paraspinal findings. Disc levels: L1-2: Mild loss of disc height with disc bulging and a broad-based central disc protrusion with covering spur. Mild facet hypertrophy. Mild spinal stenosis with mild asymmetric narrowing of the left lateral recess and left foramen. L2-3: Chronic loss of disc height with annular disc bulging and endplate osteophytes. Mild facet hypertrophy. Mild spinal stenosis with mild narrowing of the foramina and right lateral recess. L3-4: Chronic loss of disc height with annular disc bulging and endplate osteophytes asymmetric to the right. Mild bilateral facet hypertrophy. No significant spinal stenosis. Mild asymmetric narrowing of the right lateral recess and right foramen. L4-5: Previous posterior decompression on the right. Chronic loss of disc height with annular disc bulging and endplate  osteophytes asymmetric to the right. Mild bilateral facet hypertrophy. No significant recurrent spinal stenosis. Mild foraminal narrowing, right greater than left. L5-S1: Chronic loss of disc height with annular disc bulging and endplate osteophytes asymmetric to the right. Mild bilateral facet hypertrophy moderate asymmetric narrowing of the right lateral recess and right foramen. Mild left foraminal narrowing. IMPRESSION: 1. Acute nondisplaced fractures of both sacral ala with mild focal angulation at S3. 2. No acute thoracic or lumbar spine fractures. The previously demonstrated mild superior endplate compression deformities at T5 and T6 appear healed. 3. Multilevel spondylosis as described with resulting mild spinal stenosis at C6-7, T10-11, L1-2 and L2-3. Mild to moderate foraminal narrowing as detailed above, greatest on the right at L5-S1. No high-grade spinal stenosis. 4. Previous posterior decompression on the right at L4-5. Electronically Signed   By: Elsie Perone M.D.   On: 05/08/2024 16:05   MR LUMBAR SPINE WO CONTRAST Result Date: 05/08/2024 CLINICAL DATA:  Back pain after falling at church. Age-indeterminate midthoracic compression deformities on CT. EXAM: MRI THORACIC AND LUMBAR SPINE WITHOUT CONTRAST TECHNIQUE: Multiplanar and multiecho pulse sequences of the thoracic and lumbar spine were obtained without intravenous contrast. COMPARISON:  Same day CT of the chest, abdomen and pelvis with reformatted images of the thoracolumbar spine. FINDINGS: MRI THORACIC SPINE FINDINGS Alignment: Near anatomic. Minimal degenerative anterolisthesis at T10-11. Vertebrae: The previously demonstrated mild superior endplate compression deformities at T5 and T6 appear healed, without residual marrow edema. No evidence of acute thoracic spine fracture, osseous retropulsion or aggressive osseous lesion. Cord:  The thoracic cord appears normal in signal and caliber. Paraspinal and other soft tissues: No significant  paraspinal abnormalities. Disc levels: Sagittal images demonstrate spondylosis in the lower cervical spine with disc space narrowing and posterior osteophytes at C6-7 and C7-T1. There is mild spinal stenosis at C6-7 and mild foraminal narrowing bilaterally at both levels. No significant spinal stenosis or foraminal narrowing in the upper thoracic spine. T7-8: Small right paracentral disc protrusion without cord deformity or significant foraminal narrowing. T8-9: Loss of disc height with disc bulging and posterior osteophytes. No spinal stenosis. Mild foraminal narrowing bilaterally. T9-10: Mild loss of disc height with a shallow right paracentral disc protrusion and bilateral facet hypertrophy. No spinal stenosis. Mild foraminal narrowing bilaterally. T10-11: Loss of disc height with disc bulging and posterior osteophytes. Moderate bilateral facet hypertrophy. Resulting mild spinal stenosis without cord deformity. Mild to moderate foraminal narrowing bilaterally. T11-12: Mild disc bulging and bilateral facet hypertrophy. No significant spinal stenosis or foraminal narrowing. T12-L1: Mild disc bulging and endplate osteophytes asymmetric to the left. No significant spinal stenosis. Mild left foraminal narrowing. MRI LUMBAR SPINE FINDINGS Segmentation: There are 5 lumbar type  vertebral bodies. Alignment: Convex left scoliosis with grade 1 retrolisthesis at L5-S1. Vertebrae: No evidence of acute lumbar spine fracture, pars defect or osteomyelitis. There is marrow edema in both sacral ala consistent with nondisplaced acute fractures. There is mild focal angulation at S3. Mild sacroiliac degenerative changes bilaterally. Conus medullaris: Extends to the L1 level and appears normal. Paraspinal and other soft tissues: No significant paraspinal findings. Disc levels: L1-2: Mild loss of disc height with disc bulging and a broad-based central disc protrusion with covering spur. Mild facet hypertrophy. Mild spinal stenosis with  mild asymmetric narrowing of the left lateral recess and left foramen. L2-3: Chronic loss of disc height with annular disc bulging and endplate osteophytes. Mild facet hypertrophy. Mild spinal stenosis with mild narrowing of the foramina and right lateral recess. L3-4: Chronic loss of disc height with annular disc bulging and endplate osteophytes asymmetric to the right. Mild bilateral facet hypertrophy. No significant spinal stenosis. Mild asymmetric narrowing of the right lateral recess and right foramen. L4-5: Previous posterior decompression on the right. Chronic loss of disc height with annular disc bulging and endplate osteophytes asymmetric to the right. Mild bilateral facet hypertrophy. No significant recurrent spinal stenosis. Mild foraminal narrowing, right greater than left. L5-S1: Chronic loss of disc height with annular disc bulging and endplate osteophytes asymmetric to the right. Mild bilateral facet hypertrophy moderate asymmetric narrowing of the right lateral recess and right foramen. Mild left foraminal narrowing. IMPRESSION: 1. Acute nondisplaced fractures of both sacral ala with mild focal angulation at S3. 2. No acute thoracic or lumbar spine fractures. The previously demonstrated mild superior endplate compression deformities at T5 and T6 appear healed. 3. Multilevel spondylosis as described with resulting mild spinal stenosis at C6-7, T10-11, L1-2 and L2-3. Mild to moderate foraminal narrowing as detailed above, greatest on the right at L5-S1. No high-grade spinal stenosis. 4. Previous posterior decompression on the right at L4-5. Electronically Signed   By: Elsie Perone M.D.   On: 05/08/2024 16:05   CT L-SPINE NO CHARGE Result Date: 05/08/2024 CLINICAL DATA:  Generalized back pain after falling at church a couple weeks earlier. Aortic aneurysm suspected. EXAM: CT Thoracic and Lumbar spine without contrast TECHNIQUE: Multiplanar CT images of the thoracic and lumbar spine were  reconstructed from contemporary CT of the Chest, Abdomen, and Pelvis. RADIATION DOSE REDUCTION: This exam was performed according to the departmental dose-optimization program which includes automated exposure control, adjustment of the mA and/or kV according to patient size and/or use of iterative reconstruction technique. CONTRAST:  No additional COMPARISON:  Cervical MRI 03/16/2021. Lumbar spine radiographs 01/10/2015. FINDINGS: CT THORACIC SPINE FINDINGS Alignment: Straightening with minimal thoracic scoliosis. No focal angulation or significant listhesis. Vertebrae: Mild superior endplate compression deformity of T5 asymmetric to the right, resulting in approximately 25% loss of vertebral body height. This fracture is age indeterminate, without significant osseous retropulsion. Mild superior endplate compression deformity at T6 asymmetric to the left, resulting in approximately 10 % loss of vertebral body height. This fracture is age indeterminate, without significant osseous retropulsion. No other compression deformities demonstrated. Paraspinal and other soft tissues: No significant paraspinal findings are demonstrated. Chest and vascular details dictated separately. Disc levels: Mild multilevel spondylosis with disc bulging and endplate osteophytes. No high-grade spinal stenosis or high-grade foraminal narrowing identified. CT LUMBAR SPINE FINDINGS Segmentation: There are 5 lumbar type vertebral bodies. Alignment: Moderate convex left scoliosis centered at L4. No focal angulation or significant listhesis. Vertebrae: No evidence of acute lumbar spine fracture or traumatic subluxation.  Previous posterior decompression on the right at L4-5. Paraspinal and other soft tissues: No acute paraspinal findings are demonstrated. Intra-abdominal and vascular details dictated separately. Disc levels: Multilevel spondylosis with disc space narrowing, endplate osteophytes and facet hypertrophy. Resulting mild spinal  stenosis and mild foraminal narrowing at multiple levels. No significant soft disc herniation identified. IMPRESSION: 1. Age-indeterminate mild superior endplate compression deformities at T5 and T6 as described. No significant osseous retropulsion. Correlate with point tenderness. These could be further evaluated with thoracic MRI if clinically warranted. 2. No evidence of acute lumbar spine fracture or traumatic subluxation. 3. Multilevel spondylosis as described. No high-grade spinal stenosis or high-grade foraminal narrowing identified. 4. Chest, abdomen and vascular findings dictated separately. Electronically Signed   By: Elsie Perone M.D.   On: 05/08/2024 12:43   CT T-SPINE NO CHARGE Result Date: 05/08/2024 CLINICAL DATA:  Generalized back pain after falling at church a couple weeks earlier. Aortic aneurysm suspected. EXAM: CT Thoracic and Lumbar spine without contrast TECHNIQUE: Multiplanar CT images of the thoracic and lumbar spine were reconstructed from contemporary CT of the Chest, Abdomen, and Pelvis. RADIATION DOSE REDUCTION: This exam was performed according to the departmental dose-optimization program which includes automated exposure control, adjustment of the mA and/or kV according to patient size and/or use of iterative reconstruction technique. CONTRAST:  No additional COMPARISON:  Cervical MRI 03/16/2021. Lumbar spine radiographs 01/10/2015. FINDINGS: CT THORACIC SPINE FINDINGS Alignment: Straightening with minimal thoracic scoliosis. No focal angulation or significant listhesis. Vertebrae: Mild superior endplate compression deformity of T5 asymmetric to the right, resulting in approximately 25% loss of vertebral body height. This fracture is age indeterminate, without significant osseous retropulsion. Mild superior endplate compression deformity at T6 asymmetric to the left, resulting in approximately 10 % loss of vertebral body height. This fracture is age indeterminate, without  significant osseous retropulsion. No other compression deformities demonstrated. Paraspinal and other soft tissues: No significant paraspinal findings are demonstrated. Chest and vascular details dictated separately. Disc levels: Mild multilevel spondylosis with disc bulging and endplate osteophytes. No high-grade spinal stenosis or high-grade foraminal narrowing identified. CT LUMBAR SPINE FINDINGS Segmentation: There are 5 lumbar type vertebral bodies. Alignment: Moderate convex left scoliosis centered at L4. No focal angulation or significant listhesis. Vertebrae: No evidence of acute lumbar spine fracture or traumatic subluxation. Previous posterior decompression on the right at L4-5. Paraspinal and other soft tissues: No acute paraspinal findings are demonstrated. Intra-abdominal and vascular details dictated separately. Disc levels: Multilevel spondylosis with disc space narrowing, endplate osteophytes and facet hypertrophy. Resulting mild spinal stenosis and mild foraminal narrowing at multiple levels. No significant soft disc herniation identified. IMPRESSION: 1. Age-indeterminate mild superior endplate compression deformities at T5 and T6 as described. No significant osseous retropulsion. Correlate with point tenderness. These could be further evaluated with thoracic MRI if clinically warranted. 2. No evidence of acute lumbar spine fracture or traumatic subluxation. 3. Multilevel spondylosis as described. No high-grade spinal stenosis or high-grade foraminal narrowing identified. 4. Chest, abdomen and vascular findings dictated separately. Electronically Signed   By: Elsie Perone M.D.   On: 05/08/2024 12:43   CT Angio Chest/Abd/Pel for Dissection W and/or Wo Contrast Result Date: 05/08/2024 CLINICAL DATA:  Aortic aneurysm suspected.  Back pain. EXAM: CT ANGIOGRAPHY CHEST, ABDOMEN AND PELVIS TECHNIQUE: Non-contrast CT of the chest was initially obtained. Multidetector CT imaging through the chest,  abdomen and pelvis was performed using the standard protocol during bolus administration of intravenous contrast. Multiplanar reconstructed images and MIPs were obtained and reviewed  to evaluate the vascular anatomy. RADIATION DOSE REDUCTION: This exam was performed according to the departmental dose-optimization program which includes automated exposure control, adjustment of the mA and/or kV according to patient size and/or use of iterative reconstruction technique. CONTRAST:  75mL OMNIPAQUE  IOHEXOL  350 MG/ML SOLN COMPARISON:  None Available. FINDINGS: CTA CHEST FINDINGS Cardiovascular: Normal caliber of the thoracic aorta. Ascending thoracic aorta measures 3.0 cm. No evidence for intramural hematoma or aortic dissection. Aortic arch great vessels are patent with typical three-vessel arch configuration. Proximal vertebral arteries are patent bilaterally. Atherosclerotic calcifications involving the thoracic aorta. Heart size normal. No significant pericardial effusion. Coronary artery calcifications. Mediastinum/Nodes: Probable small hiatal hernia. Thyroid tissue is unremarkable. No mediastinal, hilar or axillary lymph node enlargement. Lungs/Pleura: Centrilobular emphysema. No pleural effusions. No large areas of airspace disease or lung consolidation. Mild scarring at the lung apices. Small scattered nodular densities. Index nodule measures 3 mm in the right upper lobe on image 41, sequence 4. These small nodular densities are likely incidental findings. Musculoskeletal: Disc space narrowing in the lower cervical spine. No evidence for an acute compression fracture involving the thoracic vertebral bodies. Sternum is intact. Review of the MIP images confirms the above findings. CTA ABDOMEN AND PELVIS FINDINGS VASCULAR Aorta: Diffuse atherosclerotic disease in the abdominal aorta without aneurysm, dissection or significant stenosis. Celiac: Patent without evidence of aneurysm, dissection, vasculitis or significant  stenosis. SMA: Patent with mild stenosis at the origin. No evidence for aneurysm or dissection. Renals: Single renal arteries bilaterally. Left renal artery is patent without significant stenosis. At least mild stenosis near the origin of the right renal artery. No evidence for aneurysm or dissection involving the renal arteries. IMA: Patent Inflow: Diffuse atherosclerotic calcifications involving the common, internal and external iliac arteries. No evidence for aneurysm or significant stenosis involving the common or external iliac arteries. Proximal femoral arteries are patent bilaterally. Veins: No obvious venous abnormality within the limitations of this arterial phase study. Review of the MIP images confirms the above findings. NON-VASCULAR Hepatobiliary: Normal appearance of the liver and gallbladder. Pancreas: Unremarkable. No pancreatic ductal dilatation or surrounding inflammatory changes. Spleen: Normal in size without focal abnormality. Adrenals/Urinary Tract: Adrenal glands are within normal limits. Negative for kidney stones. Normal appearance of both kidneys without hydronephrosis. No suspicious renal lesions. Urinary bladder is within normal limits. Stomach/Bowel: Rectum is distended with a large amount of stool. Probable small hiatal hernia. No evidence for bowel obstruction. No focal bowel inflammation. Small bowel loops herniating into the left inguinal hernia. Lymphatic: No significant lymph node enlargement in the abdomen or pelvis. Reproductive: Prostate is unremarkable. Other: Left inguinal hernia containing small bowel loops. Mild perirectal stranding probably related to the rectal distension and large stool burden. Negative for free air. Musculoskeletal: Sclerosis involving the left acetabulum is nonspecific but possibly incidental. Multilevel disc space narrowing in lumbar spine. Negative for acute fracture. Levoscoliosis in lumbar spine with the apex at L3-L4. Review of the MIP images  confirms the above findings. IMPRESSION: 1. No evidence for an aortic aneurysm or dissection. 2. No acute abnormality in the chest, abdomen or pelvis. 3. Left inguinal hernia containing small bowel loops. No evidence for a bowel obstruction. 4. Large amount of stool in the rectum. 5. Small scattered pulmonary nodules are likely incidental. 6. Aortic Atherosclerosis (ICD10-I70.0) and Emphysema (ICD10-J43.9). Electronically Signed   By: Juliene Balder M.D.   On: 05/08/2024 12:26     Procedures   Medications Ordered in the ED  fentaNYL (SUBLIMAZE) injection 50  mcg (50 mcg Intravenous Given 05/08/24 1112)  iohexol  (OMNIPAQUE ) 350 MG/ML injection 75 mL (75 mLs Intravenous Contrast Given 05/08/24 1129)  oxyCODONE-acetaminophen (PERCOCET/ROXICET) 5-325 MG per tablet 1 tablet (1 tablet Oral Given 05/08/24 1346)                                      89yo male with history of hypertension, hyperlipidemia who presents with concern for back pain and leg pain.   DDx includes aortic dissection, fracture, AAA, pyelonephritis, disc disease.   Labs completed show mild leukocytosis, no anemia, no transaminitis, AKI or electrolyte abnormalities.  UA without infection.  CTA dissection study was completed given his severe back pain, leg pain, hypertension and some abdominal tenderness. CTA showed no evidence of aortic dissection, did show T5 and T6 age-indeterminate fractures, left inguinal hernia containing small bowel loops without evidence of obstruction.  (He is aware of this)  Ordered TLSO with concern for thoracic fractures.   Ordered MRI T/L spine given severity of pain and pain limiting strength--- to evaluate for fracture or significant disc disease.  Signed out with MRI results pending.          Final diagnoses:  Acute midline thoracic back pain  Acute midline low back pain, unspecified whether sciatica present    ED Discharge Orders     None          Dreama Longs, MD 05/08/24  1635

## 2024-05-08 NOTE — ED Triage Notes (Signed)
 C/o back pain states he fell several weeks ago , had xrays and has a MRI scheduled for 12/9 states he can't wait until then . Denies numbness in legs

## 2024-05-08 NOTE — ED Notes (Signed)
 Patient transported to MRI

## 2024-05-08 NOTE — ED Triage Notes (Signed)
 Pt states he was at church and fell backwards a couple of weeks ago. Pt states he hit his head when falling, no LOC. C/O back pain all over. Axox4.

## 2024-05-08 NOTE — ED Provider Notes (Signed)
 Care transferred to me.  MRI shows sacral alla fractures bilaterally.  Left worse than right.  This correlates with his pain.  He does not have any thoracic pain and the MRI seems to indicate that thoracic fractures are old.  Due to this I canceled the TLSO.  He is feeling like his pain is better controlled and was able to walk with a walker a few steps and feels well enough for discharge.  Was tachycardic on my exam but this is improving.  Has also been hypertensive but states he is compliant with his blood pressure meds.  Part of this may be situational versus pain.  He will need to have this followed up as an outpatient with his doctor.  He is requesting discharge, will order some home health and prescribe a short course of oxycodone for pain as it seemed to help in the ER.  Will discharge home with return precautions and give Ortho follow-up.   Freddi Hamilton, MD 05/08/24 [phone]

## 2024-05-11 ENCOUNTER — Other Ambulatory Visit (INDEPENDENT_AMBULATORY_CARE_PROVIDER_SITE_OTHER): Payer: Self-pay

## 2024-05-11 ENCOUNTER — Ambulatory Visit: Admitting: Orthopedic Surgery

## 2024-05-11 DIAGNOSIS — R102 Pelvic and perineal pain unspecified side: Secondary | ICD-10-CM

## 2024-05-11 MED ORDER — OXYCODONE HCL 5 MG PO TABS: 5.0000 mg | ORAL_TABLET | ORAL | 0 refills | Status: DC | PRN

## 2024-05-11 NOTE — Progress Notes (Signed)
 Orthopedic Surgery Progress Note   Assessment: Patient is a 89 y.o. male with minimally displaced S3 fracture with kyphotic alignment, nondisplaced sacral ala fractures   Plan: -No operative plans at this time -Recommended offloading the sacrum -Prescribed oxycodone for pain relief -Weightbearing as tolerated bilateral lower extremities - Patient should return the office in 3 weeks, x-rays at next visit: AP/lateral pelvis  ___________________________________________________________________________  Subjective: Patient had a fall while at church a couple of days ago.  He had immediate onset of pain in the area of the buttock.  He had difficulty with mobilizing as a result of the pain.  He was brought to the [facility], ER.  He was found to have a minimally displaced and angulated sacral fracture and sacral ala fracture.  He was able to mobilize with oxycodone and was given orthopedic follow-up.  His pain is similar to how it was couple days ago.  Oxycodone is controlling the pain.    Physical Exam:  General: no acute distress, appears stated age Neurologic: alert, answering questions appropriately, following commands Respiratory: unlabored breathing on room air, symmetric chest rise Psychiatric: appropriate affect, normal cadence to speech  MSK:   -Bilateral lower extremity  Pain with FADIR, pain with FABER, TTP over the sacrum and coccyx EHL/TA/GSC intact Plantarflexes and dorsiflexes toes Sensation intact to light touch in sural, saphenous, tibial, deep peroneal, and superficial peroneal nerve distributions Foot warm and well perfused   Imaging: XRs of the pelvis from 05/11/2024 were independently reviewed and interpreted, showing no disruption to the pelvic ring.  No dislocation seen.  Known fractures is not well-visualized on the x-ray.  MRI of the lumbar spine from 05/08/2024 were independently reviewed and interpreted, showing nondisplaced sacral ala fracture bilaterally.  Mild kyphosis at S3 in the sacrum. Increased signal seen around the fracture on STIR sequence.    Patient name: Jonathon Benson Patient MRN: [id number] Date: 05/11/24

## 2024-05-17 ENCOUNTER — Ambulatory Visit: Admitting: Orthopedic Surgery

## 2024-05-17 DIAGNOSIS — S32810D Multiple fractures of pelvis with stable disruption of pelvic ring, subsequent encounter for fracture with routine healing: Secondary | ICD-10-CM

## 2024-05-17 MED ORDER — OXYCODONE HCL 5 MG PO TABS: 5.0000 mg | ORAL_TABLET | ORAL | 0 refills | Status: AC | PRN

## 2024-05-17 NOTE — Progress Notes (Signed)
 Orthopedic Surgery Progress Note     Assessment: Patient is a 89 y.o. male with minimally displaced S3 fracture with kyphotic alignment, nondisplaced sacral ala fractures     Plan: -No operative plans at this time -Patient has not been offloading the sacrum. Discussed offloading the sacrum.  Recommended blankets around the thighs and legs as opposed to a foam doughnut to better offload the area.  Given the severity of his injury, I have recommended limiting his activities to the household and avoiding significant walking.  I told him that this injury can be unstable given the bilateral sacral ala fractures and we will need to monitor closely.  With his age, he does not want undergo any surgery -His main area of pain is the sacrum -Prescribed more oxycodone  for pain relief.  Encouraged him to use Tylenol , ibuprofen, and lidocaine patches in addition to the oxycodone  to help with the pain - Patient should return the office in 2-3 weeks, x-rays at next visit: AP/lateral pelvis   ___________________________________________________________________________   Subjective: Patient continues to have significant pain.  He mistakenly thought his appointment was for today so he came in.  He has been using the oxycodone  but not any other medications to help with the pain.  He said difficulty with getting around due to the pain.  He has been using a foam doughnut and is often sitting directly on his buttock.  The main pain is in the midline near the gluteal cleft.  He has no pain radiating into either lower extremity.  No bowel or bladder incontinence.  No saddle anesthesia.     Physical Exam:   General: no acute distress, appears stated age Neurologic: alert, answering questions appropriately, following commands Respiratory: unlabored breathing on room air, symmetric chest rise Psychiatric: appropriate affect, normal cadence to speech   MSK:    -Bilateral lower extremity             Pain with FADIR,  pain with FABER, TTP over the sacrum and coccyx EHL/TA/GSC intact Plantarflexes and dorsiflexes toes Sensation intact to light touch in sural, saphenous, tibial, deep peroneal, and superficial peroneal nerve distributions Foot warm and well perfused     Imaging: XRs of the pelvis from 05/11/2024 were previously independently reviewed and interpreted, showing no disruption to the pelvic ring.  No dislocation seen.  Known fractures is not well-visualized on the x-ray.   MRI of the lumbar spine from 05/08/2024 were previously independently reviewed and interpreted, showing nondisplaced sacral ala fracture bilaterally. Mild kyphosis at S3 in the sacrum. Increased signal seen around the fracture on STIR sequence.      Patient name: Jonathon Benson Patient MRN: [id number] Date: 05/17/24

## 2024-06-07 ENCOUNTER — Other Ambulatory Visit (INDEPENDENT_AMBULATORY_CARE_PROVIDER_SITE_OTHER)

## 2024-06-07 ENCOUNTER — Ambulatory Visit: Admitting: Orthopedic Surgery

## 2024-06-07 DIAGNOSIS — S32810D Multiple fractures of pelvis with stable disruption of pelvic ring, subsequent encounter for fracture with routine healing: Secondary | ICD-10-CM

## 2024-06-07 MED ORDER — OXYCODONE HCL 5 MG PO TABS: 5.0000 mg | ORAL_TABLET | ORAL | 0 refills | Status: AC | PRN

## 2024-06-07 NOTE — Progress Notes (Signed)
 Orthopedic Surgery Progress Note     Assessment: Patient is a 89 y.o. male with minimally displaced S3 fracture with kyphotic alignment, nondisplaced sacral ala fractures     Plan: -No operative plans at this time -Patient's sacral pain in the coccyx and sacral region is better.  He is not having pain over the lateral hip.  He does have exam showing possible gluteus tendon injury since he is unable to abduct his leg but this may have preexisted prior to his fracture.  Since his pain is getting better, we will continue with nonoperative treatment -Prescribed more oxycodone  today -Patient was interested in a intramuscular cortisone injection which was done today as well - Patient should return the office in 4 weeks, x-rays at next visit: AP/inlet/outlet/lateral   IM cortisone injection note: After discussing the risk, benefits, alternatives of intramuscular steroid injection, patient elected proceed.  Patient was standing for the procedure.  The skin over the gluteus muscle on the left side was prepped with an alcohol-based prep.  Ethyl chloride was used to anesthetize the skin.  A 20-gauge needle was used to inject 40 mg of Depo-Medrol understanding sterile technique.  Needle was withdrawn and a Band-Aid was applied.  Patient tolerated the procedure well.   ___________________________________________________________________________   Subjective: Patient's pain has gotten better since he was last seen in the office.  It is still significant.  He still has difficulty with ambulating long distances as a result of the pain.  He is taking less oxycodone .  He is now taking it only twice per day.  He says he takes this once in the morning once in the evening.  He is no longer having the pain near the sacrum and the area of the gluteal cleft.  His pain is now more over the lateral and posterolateral aspect of the left hip.  He has no pain on the right side.  He has no pain radiating into his legs.  He is  ambulating with a walker.     Physical Exam:   General: no acute distress, appears stated age Neurologic: alert, answering questions appropriately, following commands Respiratory: unlabored breathing on room air, symmetric chest rise Psychiatric: appropriate affect, normal cadence to speech   MSK:    -Bilateral lower extremity             Pain with FADIR, pain with FABER, no tenderness to palpation over the sacrum or the coccyx, tenderness to palpation over the posterolateral aspect of the greater trochanter on the left side.  No other tenderness palpation over the remainder of the pelvis or hips EHL/TA/GSC intact Plantarflexes and dorsiflexes toes Sensation intact to light touch in sural, saphenous, tibial, deep peroneal, and superficial peroneal nerve distributions Foot warm and well perfused     Imaging: XRs of the pelvis from 06/07/2024 were independently reviewed and interpreted, showing well aligned pelvic ring.  There is focal kyphosis in the sacrum around S3.  No dislocation seen.  No other fracture seen except the one in the sacrum with kyphosis.   MRI of the lumbar spine from 05/08/2024 were previously independently reviewed and interpreted, showing nondisplaced sacral ala fracture bilaterally. Mild kyphosis at S3 in the sacrum. Increased signal seen around the fracture on STIR sequence.      Patient name: Jonathon Benson Patient MRN: [id number] Date: 06/07/2024

## 2024-06-21 ENCOUNTER — Telehealth: Payer: Self-pay | Admitting: Orthopedic Surgery

## 2024-06-21 MED ORDER — TRAMADOL HCL 50 MG PO TABS: 50.0000 mg | ORAL_TABLET | Freq: Four times a day (QID) | ORAL | 0 refills | Status: AC | PRN

## 2024-06-21 NOTE — Telephone Encounter (Signed)
 Pt's wife Marcos called requesting a refill of tramadol . Please send to pharmacy on file. Pt number is [phone].

## 2024-06-26 ENCOUNTER — Ambulatory Visit: Admitting: Orthopedic Surgery

## 2024-07-07 ENCOUNTER — Telehealth: Payer: Self-pay | Admitting: Orthopedic Surgery

## 2024-07-07 MED ORDER — OXYCODONE HCL 5 MG PO TABS: 5.0000 mg | ORAL_TABLET | ORAL | 0 refills | Status: AC | PRN

## 2024-07-07 NOTE — Telephone Encounter (Signed)
 I called and advised Jonathon Benson that this sent in for them.

## 2024-07-07 NOTE — Telephone Encounter (Signed)
 Pt's wife marcos called requesting refill of oxycodone  to be sent to [facility]. Pt number is [phone]

## 2024-07-13 ENCOUNTER — Ambulatory Visit: Admitting: Orthopedic Surgery

## 2024-08-23 ENCOUNTER — Ambulatory Visit: Admitting: Orthopedic Surgery
